# Patient Record
Sex: Female | Born: 1990 | Race: Black or African American | Hispanic: Yes | Marital: Single | Smoking: Never smoker
Health system: Southern US, Community
[De-identification: ages and names within clinical notes are randomized; demographics above are authoritative.]

## PROBLEM LIST (undated history)

## (undated) DIAGNOSIS — E282 Polycystic ovarian syndrome: Secondary | ICD-10-CM

## (undated) DIAGNOSIS — F32A Depression, unspecified: Secondary | ICD-10-CM

## (undated) DIAGNOSIS — F329 Major depressive disorder, single episode, unspecified: Secondary | ICD-10-CM

## (undated) HISTORY — DX: Depression, unspecified: F32.A

## (undated) HISTORY — DX: Polycystic ovarian syndrome: E28.2

## (undated) HISTORY — DX: Major depressive disorder, single episode, unspecified: F32.9

---
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
## 2010-09-21 ENCOUNTER — Encounter: Payer: Self-pay | Admitting: Obstetrics and Gynecology

## 2010-09-21 ENCOUNTER — Ambulatory Visit: Payer: Self-pay | Admitting: Obstetrics & Gynecology

## 2010-09-21 LAB — CONVERTED CEMR LAB
Chlamydia, DNA Probe: NEGATIVE
GC Probe Amp, Genital: NEGATIVE

## 2010-09-22 ENCOUNTER — Encounter: Payer: Self-pay | Admitting: Obstetrics and Gynecology

## 2011-03-17 ENCOUNTER — Other Ambulatory Visit: Payer: Self-pay | Admitting: Family Medicine

## 2011-03-17 ENCOUNTER — Other Ambulatory Visit (HOSPITAL_COMMUNITY)
Admission: RE | Admit: 2011-03-17 | Discharge: 2011-03-17 | Disposition: A | Discharge status: Home / Self Care | Payer: 59 | Source: Ambulatory Visit | Attending: Family Medicine | Admitting: Family Medicine

## 2011-03-17 DIAGNOSIS — N912 Amenorrhea, unspecified: Secondary | ICD-10-CM

## 2011-03-17 DIAGNOSIS — Z124 Encounter for screening for malignant neoplasm of cervix: Secondary | ICD-10-CM | POA: Insufficient documentation

## 2011-04-02 ENCOUNTER — Ambulatory Visit
Admission: RE | Admit: 2011-04-02 | Discharge: 2011-04-02 | Disposition: A | Discharge status: Home / Self Care | Payer: 59 | Source: Ambulatory Visit | Attending: Family Medicine | Admitting: Family Medicine

## 2011-04-02 DIAGNOSIS — N912 Amenorrhea, unspecified: Secondary | ICD-10-CM

## 2011-05-11 NOTE — Group Therapy Note (Signed)
NAMEHARPREET, SIGNORE NO.:  [id number]   MEDICAL RECORD NO.:  [id number]          PATIENT TYPE:  WOC   LOCATION:  [facility]                   FACILITY:  [facility]   PHYSICIAN:  Dorthula Perfect, MD     DATE OF BIRTH:  [birth year]   DATE OF SERVICE:  10/23/2008                                  CLINIC NOTE   HISTORY:  This 20 year old single black female, gravida 0, last  menstrual period May 2009 is referred here for missing her periods and  management of birth control.  Since May of this year, she has not had a  menstrual period.  Prior to that time, she was having menstrual periods  every other month and would never go more than 1 month without a period.  She is a Printmaker in college.  She is sexually active and does not  practice very good birth control.  She is complaining of frequency of  urination and a vaginal discharge.  She has never had a Pap smear.   REVIEW OF SYSTEMS:  Unremarkable.  She has no other genitourinary or GI  complaints.   MEDICATIONS:  None.   PAST SURGICAL HISTORY:  None.   PHYSICAL EXAMINATION:  VITAL SIGNS:  Height 61 inches, weight 164, blood  pressure 112/71.  ABDOMEN:  Her exam is directed to the abdomen and pelvis.  Her abdomen  is soft and nontender.  PELVIC:  External genitalia and BUS glands are normal.  Vaginal vault  was completely normal.  Cervix was nulliparous and epithelialized.  She  did have a slight amount of whitish vaginal discharge.  Uterus is in the  midline and is of normal size and shape.  Adnexal structures were  normal.   Wet prep was done which I reviewed.  It is completely normal.   ASSESSMENT:  1. Secondary amenorrhea.  2. Dysuria and urinary frequency.   DISPOSITION:  1. Clean-catch urine for routine and culture.  2. 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  She will start Provera 10 mg to take 1 a      day for 10 days to bring on her period.   Following that, she will      start Loestrin FE which she will take.  She is given 2      prescriptions and will follow up at the [facility] for      ongoing birth control.  I strongly urged the birth control as it      will keep her periods regular and will provide her good birth      control.           ______________________________  Dorthula Perfect, MD     ER/MEDQ  D:  10/23/2008  T:  10/23/2008  Job:  [id number]   cc:   [facility]

## 2011-09-28 LAB — POCT URINALYSIS DIP (DEVICE)
Nitrite: NEGATIVE
Operator id: 297281
Protein, ur: NEGATIVE
Urobilinogen, UA: 0.2
pH: 7.5

## 2012-01-27 IMAGING — US US TRANSVAGINAL NON-OB
1 series · 14 of 25 positions shown · non-contrast
Comparison: None.

CLINICAL DATA: Amenorrhea.  Evaluate endometrium, possible PCOS



[Series 1: us transvaginal non-ob · 0.20mm/px · 14 of 75 slices shown]
[im 1/75]
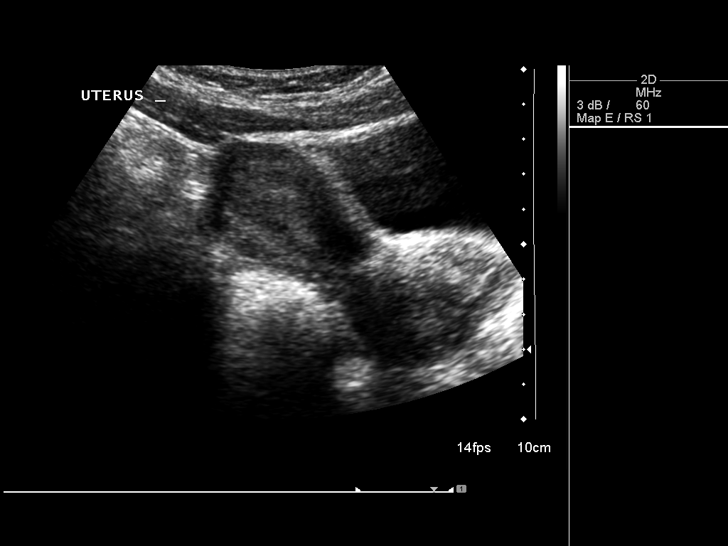
[im 7/75]
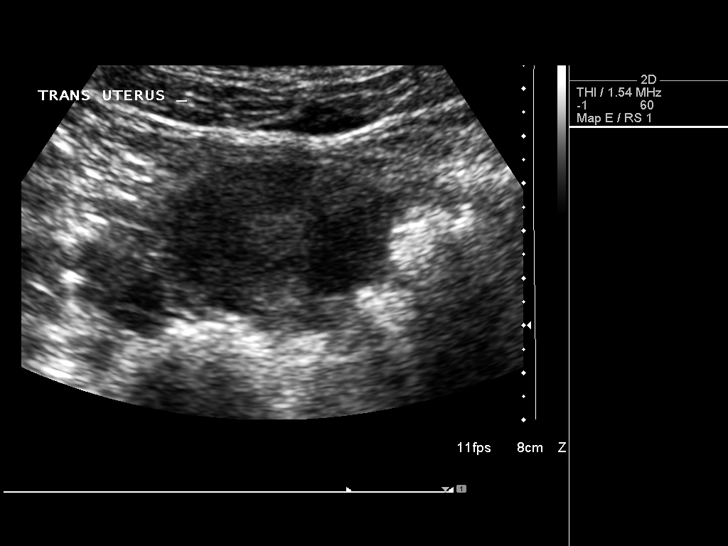
[im 13/75]
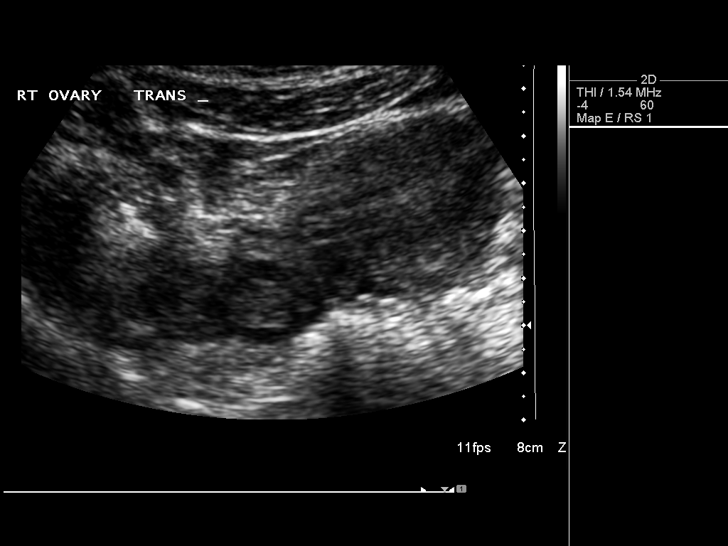
[im 19/75]
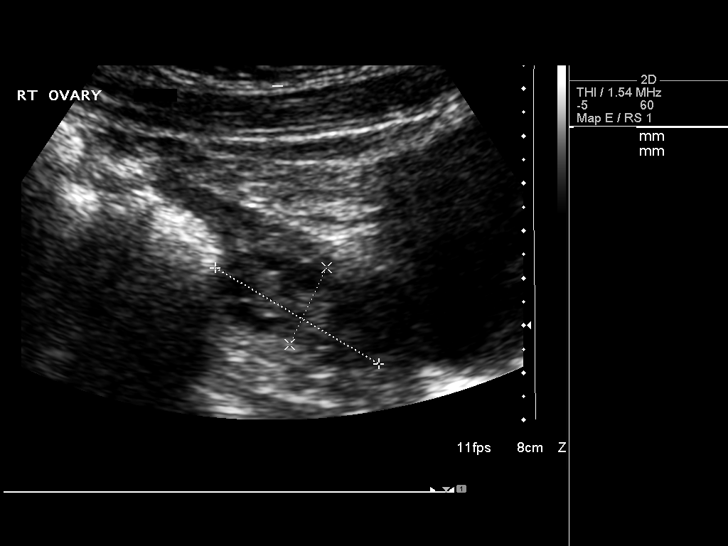
[im 25/75]
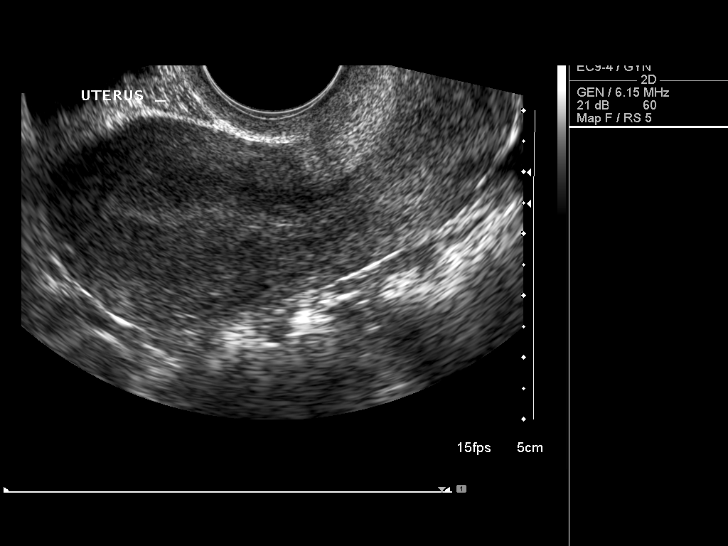
[im 28/75]
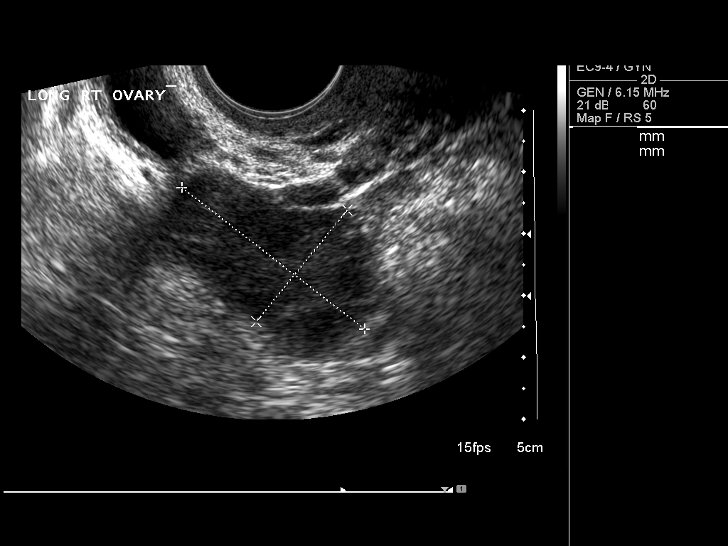
[im 34/75]
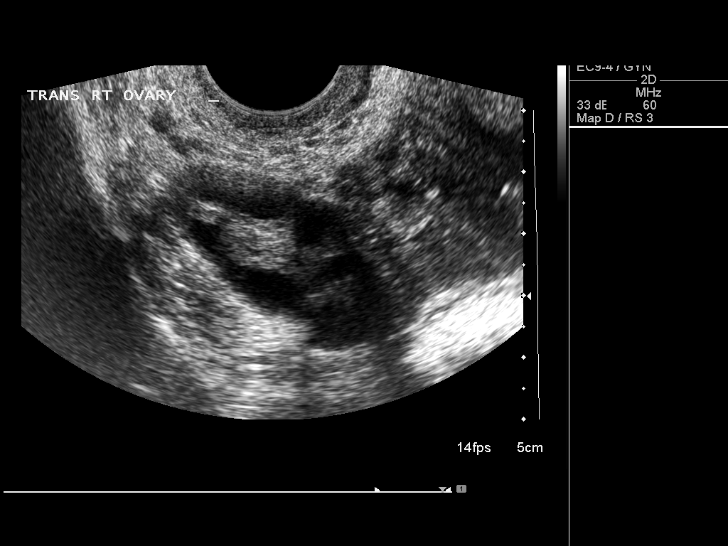
[im 41/75]
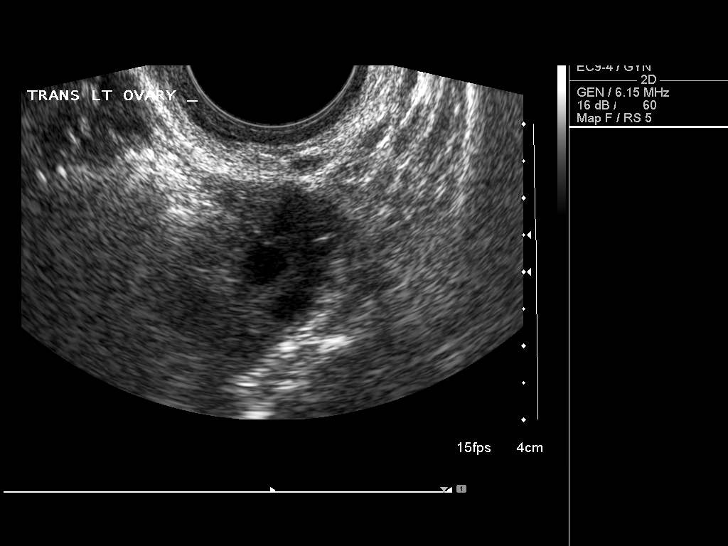
[im 47/75]
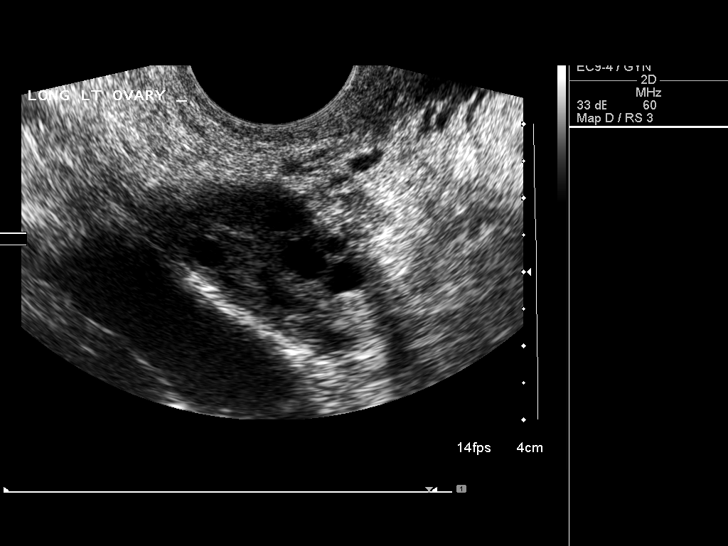
[im 50/75]
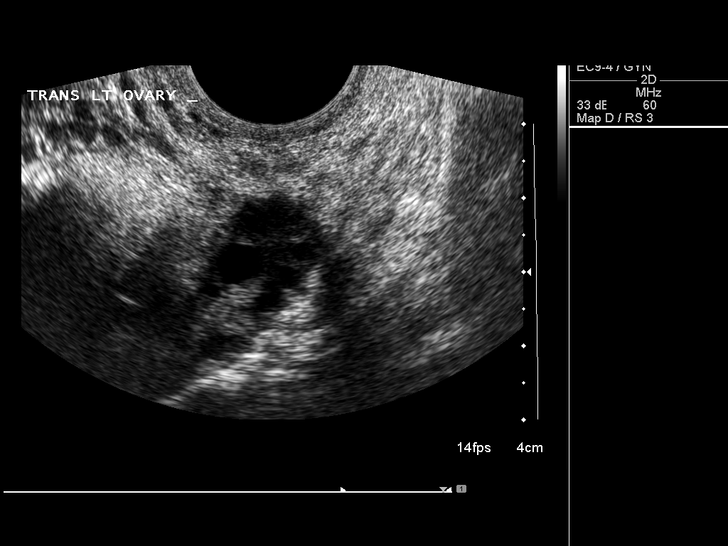
[im 56/75]
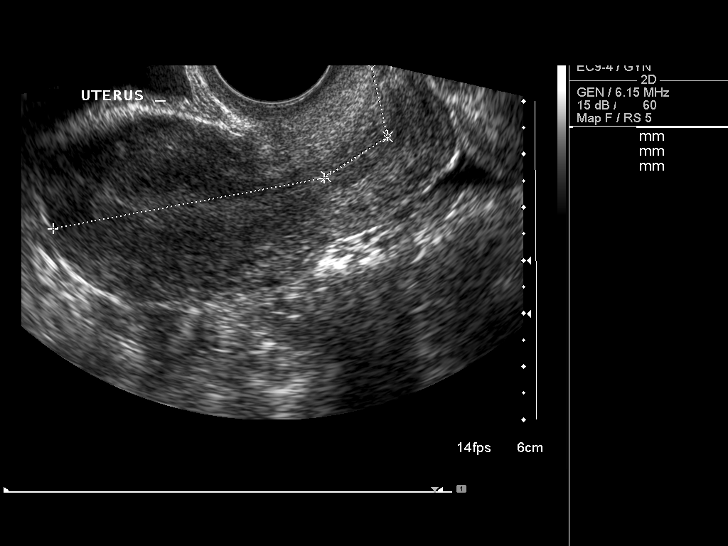
[im 62/75]
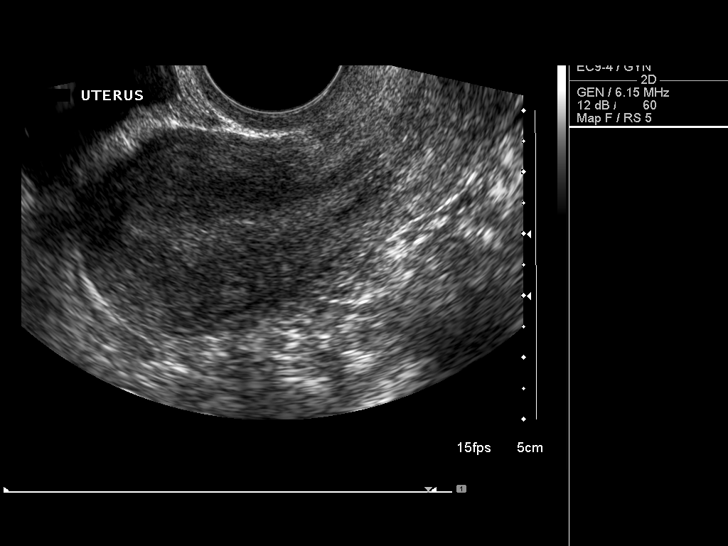
[im 68/75]
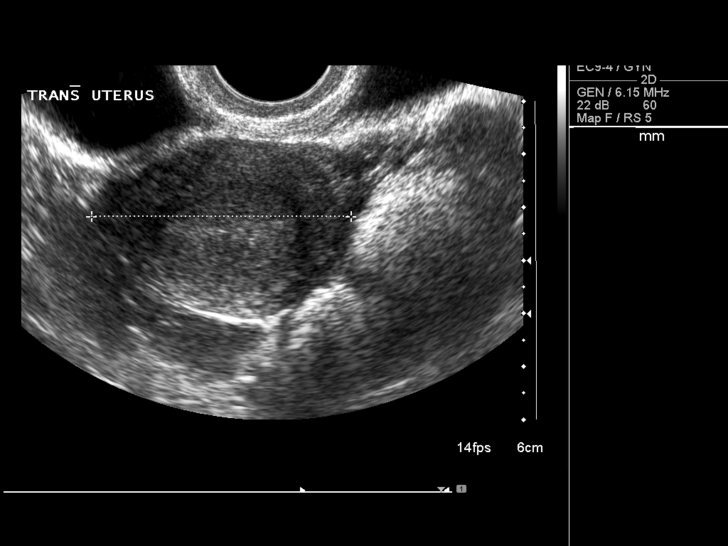
[im 75/75]
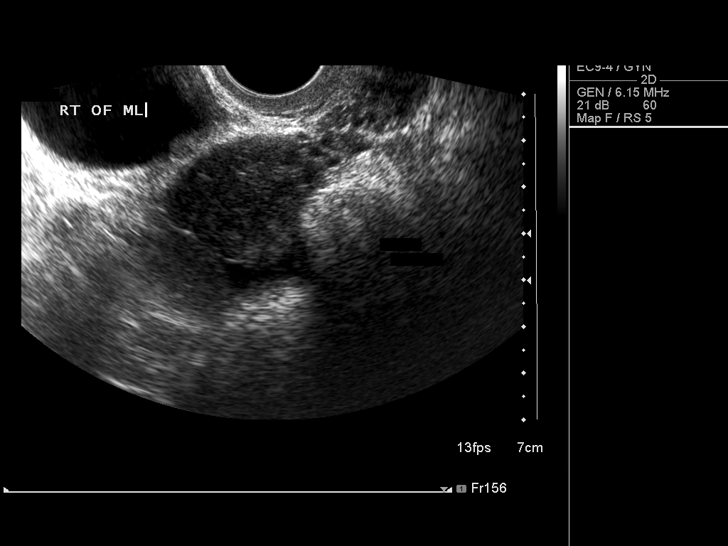

[14 of 25 positions shown; findings below may reference images not displayed]

FINDINGS: Uterus:  Measures 9.2 x 3.5 x 5.2 cm.  No fibroids or other uterine
masses identified.

Endometrium:  Uniformly echogenic measuring 14 mm.

Right ovary:  3.7 x 2.3 x 4.0 cm.  Multiple follicles are seen
along the periphery of the ovary (at least 10).  No dominant mass
is seen.

Left ovary:  Measures 3.4 x 1.8 x 1.7 cm.  Multiple peripherally at
the follicles are seen (at least 8-10).

Other findings:  Trace free fluid
IMPRESSION: Normal sonographic appearance of the uterus.  Multiple peripherally
oriented follicles seen in the left greater than right ovary
suspicious for polycystic ovarian syndrome.

## 2014-02-26 ENCOUNTER — Other Ambulatory Visit: Payer: Self-pay | Admitting: Obstetrics & Gynecology

## 2014-02-26 ENCOUNTER — Other Ambulatory Visit (HOSPITAL_COMMUNITY)
Admission: RE | Admit: 2014-02-26 | Discharge: 2014-02-26 | Disposition: A | Discharge status: Home / Self Care | Payer: BC Managed Care – PPO | Source: Ambulatory Visit | Attending: Obstetrics & Gynecology | Admitting: Obstetrics & Gynecology

## 2014-02-26 DIAGNOSIS — Z01419 Encounter for gynecological examination (general) (routine) without abnormal findings: Secondary | ICD-10-CM | POA: Insufficient documentation

## 2018-01-23 ENCOUNTER — Telehealth: Payer: Self-pay | Admitting: Obstetrics and Gynecology

## 2018-01-23 NOTE — Telephone Encounter (Signed)
Called and left a message for patient to call back to schedule a new patient doctor referral appointment with our office for a consultation re: abnormal pap results.

## 2018-01-26 NOTE — Telephone Encounter (Signed)
Called and left a message for patient to call back to schedule a new patient doctor referral appointment with our office for a consultation re: abnormal pap results. °

## 2018-02-17 ENCOUNTER — Other Ambulatory Visit: Payer: Self-pay

## 2018-02-17 ENCOUNTER — Ambulatory Visit (INDEPENDENT_AMBULATORY_CARE_PROVIDER_SITE_OTHER): Payer: BLUE CROSS/BLUE SHIELD | Admitting: Obstetrics and Gynecology

## 2018-02-17 ENCOUNTER — Encounter: Payer: Self-pay | Admitting: Obstetrics and Gynecology

## 2018-02-17 VITALS — BP 118/78 | HR 88 | Resp 16 | Ht 62.0 in | Wt 209.0 lb

## 2018-02-17 DIAGNOSIS — B977 Papillomavirus as the cause of diseases classified elsewhere: Secondary | ICD-10-CM | POA: Diagnosis not present

## 2018-02-17 DIAGNOSIS — R87612 Low grade squamous intraepithelial lesion on cytologic smear of cervix (LGSIL): Secondary | ICD-10-CM | POA: Diagnosis not present

## 2018-02-17 NOTE — Patient Instructions (Signed)
Colposcopy  Colposcopy is a procedure to examine the lowest part of the uterus (cervix), the vagina, and the area around the vaginal opening (vulva) for abnormalities or signs of disease. The procedure is done using a lighted microscope or magnifying lens (colposcope). If any unusual cells are found during the procedure, your health care provider may remove a tissue sample for testing (biopsy). A colposcopy may be done if you:   Have an abnormal Pap test. A Pap test is a screening test that is used to check for signs of cancer or infection of the vagina, cervix, and uterus.   Have a Pap smear test in which you test positive for high-risk HPV (human papillomavirus).   Have a sore or lesion on your cervix.   Have genital warts on your vulva, vagina, or cervix.   Took certain medicines while pregnant, such as diethylstilbestrol (DES).   Have pain during sexual intercourse.   Have vaginal bleeding, especially after sexual intercourse.   Need to have a cervical polyp removed.   Need to have a lost intrauterine device (IUD) string located.    Let your health care provider know about:   Any allergies you have, including allergies to prescribed medicine, latex, or iodine.   All medicines you are taking, including vitamins, herbs, eye drops, creams, and over-the-counter medicines. Bring a list of all of your medicines to your appointment.   Any problems you or family members have had with anesthetic medicines.   Any blood disorders you have.   Any surgeries you have had.   Any medical conditions you have, such as pelvic inflammatory disease (PID) or endometrial disorder.   Any history of frequent fainting.   Your menstrual cycle and what form of birth control (contraception) you use.   Your medical history, including any prior cervical treatment.   Whether you are pregnant or may be pregnant.  What are the risks?  Generally, this is a safe procedure. However, problems may occur,  including:   Pain.   Infection, which may include a fever, bad-smelling discharge, or pelvic pain.   Bleeding or discharge.   Misdiagnosis.   Fainting and vasovagal reactions, but this is rare.   Allergic reactions to medicines.   Damage to other structures or organs.    What happens before the procedure?   If you have your menstrual period or will have it at the time of your procedure, tell your health care provider. A colposcopy typically is not done during menstruation.   Continue your contraceptive practices before and after the procedure.   For 24 hours before the colposcopy:  ? Do not douche.  ? Do not use tampons.  ? Do not use medicines, creams, or suppositories in the vagina.  ? Do not have sexual intercourse.   Ask your health care provider about:  ? Changing or stopping your regular medicines. This is especially important if you are taking diabetes medicines or blood thinners.  ? Taking medicines such as aspirin and ibuprofen. These medicines can thin your blood. Do not take these medicines before your procedure if your health care provider instructs you not to. It is likely that your health care provider will tell you to avoid taking aspirin or medicine that contains aspirin for 7 days before the procedure.   Follow instructions from your health care provider about eating or drinking restrictions. You will likely need to eat a regular diet the day of the procedure and not skip any meals.   You   may have an exam or testing. A pregnancy test will be taken on the day of the procedure.   You may have a blood or urine sample taken.   Plan to have someone take you home from the hospital or clinic.   If you will be going home right after the procedure, plan to have someone with you for 24 hours.  What happens during the procedure?   You will lie down on your back, with your feet in foot rests (stirrups).   A warmed and lubricated instrument (speculum) will be inserted into your vagina. The  speculum will be used to hold apart the walls of your vagina so your health care provider can see your cervix and the inside of your vagina.   A cotton swab will be used to place a small amount of liquid solution on the areas to be examined. This solution makes it easier to see abnormal cells. You may feel a slight burning during this part.   The colposcope will be used to scan the cervix with a bright white light. The colposcope will be held near your vulvaand will magnify your vulva, vagina, and cervix for easier examination.   Your health care provider may decide to take a biopsy. If so:  ? You may be given medicine to numb the area (local anesthetic).  ? Surgical instruments will be used to suck out mucus and cells through your vagina.  ? You may feel mild pain while the tissue sample is removed.  ? Bleeding may occur. A solution may be used to stop the bleeding.  ? If a sample of tissue is needed from the inside of the cervix, a different procedure called endocervical curettage (ECC) may be completed. During this procedure, a curved instrument (curette) will be used to scrape cells from your cervix or the top of your cervix (endocervix).   Your health care provider will record the location of any abnormalities.  The procedure may vary among health care providers and hospitals.  What happens after the procedure?   You will lie down and rest for a few minutes. You may be offered juice or cookies.   Your blood pressure, heart rate, breathing rate, and blood oxygen level will be monitored until any medicines you were given have worn off.   You may have to wear compression stockings. These stockings help to prevent blood clots and reduce swelling in your legs.   You may have some cramping in your abdomen. This should go away after a few minutes.  This information is not intended to replace advice given to you by your health care provider. Make sure you discuss any questions you have with your health care  provider.  Document Released: 03/05/2003 Document Revised: 08/10/2016 Document Reviewed: 07/19/2016  Elsevier Interactive Patient Education  2018 Elsevier Inc.

## 2018-02-17 NOTE — Progress Notes (Signed)
27 y.o. G0P0000 SingleAfrican AmericanF here for a consultation from Montey HoraBill Webster, [place] for an abnormal PAP. 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  Not currently sexually active.  Period Cycle (Days): 28 Period Duration (Days): 5-6 days Period Pattern: Regular Menstrual Flow: Light Menstrual Control: Thin pad Menstrual Control Change Freq (Hours): changes pad every 5-6 hours  Dysmenorrhea: None  Patient's last menstrual period was 01/23/2018.          Sexually active: Yes.    The current method of family planning is OCP (estrogen/progesterone).    Exercising: No.  The patient does not participate in regular exercise at present. Smoker:  no  Health Maintenance: Pap:  01-05-18 LSIL + HPV History of abnormal Pap:  yes TDaP:  unsure Gardasil: completed all 3    reports that  has never smoked. she has never used smokeless tobacco. She reports that she does not drink alcohol or use drugs. She is in Graduate school for counseling.   Past Medical History:  Diagnosis Date  . Depression   . PCOS (polycystic ovarian syndrome)     History reviewed. No pertinent surgical history.  Current Outpatient Medications  Medication Sig Dispense Refill  . NIKKI 3-0.02 MG tablet   0   No current facility-administered medications for this visit.     Family History  Problem Relation Age of Onset  . Fibroids Mother   . Diabetes Mother   . Diabetes Maternal Grandmother   . Alzheimer's disease Maternal Grandmother   . Diabetes Paternal Grandmother     Review of Systems  Constitutional: Negative.   HENT: Negative.   Eyes: Negative.   Respiratory: Negative.   Cardiovascular: Negative.   Gastrointestinal: Negative.   Endocrine: Negative.   Genitourinary: Negative.   Musculoskeletal: Negative.   Skin: Negative.   Allergic/Immunologic: Negative.   Neurological: Negative.   Psychiatric/Behavioral: Negative.     Exam:   BP 118/78 (BP Location: Right Arm, Patient Position: Sitting,  Cuff Size: Normal)   Pulse 88   Resp 16   Ht 5\' 2"  (1.575 m)   Wt 209 lb (94.8 kg)   LMP 01/23/2018   BMI 38.23 kg/m   Weight change: @WEIGHTCHANGE @ Height:   Height: 5\' 2"  (157.5 cm)  Ht Readings from Last 3 Encounters:  02/17/18 5\' 2"  (1.575 m)    General appearance: alert, cooperative and appears stated age  A:  LSIL, +HPV  P:   Discussed HPV, abnormal pap's, explained cervical dysplasia, need for further evaluation  Return for colposcopy  We discussed need for f/u and possible need for treatment (if she had HSIL)  Questions answered   Montey HoraBill Webster, PA  Letter sent

## 2018-03-02 ENCOUNTER — Encounter: Payer: Self-pay | Admitting: Obstetrics and Gynecology

## 2018-03-02 ENCOUNTER — Ambulatory Visit (INDEPENDENT_AMBULATORY_CARE_PROVIDER_SITE_OTHER): Payer: BLUE CROSS/BLUE SHIELD | Admitting: Obstetrics and Gynecology

## 2018-03-02 VITALS — BP 120/78 | HR 84 | Wt 210.0 lb

## 2018-03-02 DIAGNOSIS — R87612 Low grade squamous intraepithelial lesion on cytologic smear of cervix (LGSIL): Secondary | ICD-10-CM | POA: Diagnosis not present

## 2018-03-02 DIAGNOSIS — B977 Papillomavirus as the cause of diseases classified elsewhere: Secondary | ICD-10-CM

## 2018-03-02 DIAGNOSIS — Z01812 Encounter for preprocedural laboratory examination: Secondary | ICD-10-CM | POA: Diagnosis not present

## 2018-03-02 LAB — POCT URINE PREGNANCY: PREG TEST UR: NEGATIVE

## 2018-03-02 NOTE — Patient Instructions (Signed)

## 2018-03-02 NOTE — Progress Notes (Signed)
GYNECOLOGY  VISIT   HPI: 27 y.o.   Single  African American  female   G0P0000 with Patient's last menstrual period was 02/20/2018 (exact date).   here for Colposcopy for a LSIL pap with +HPV  GYNECOLOGIC HISTORY: Patient's last menstrual period was 02/20/2018 (exact date). Contraception:OCP Menopausal hormone therapy: n/a        OB History    Gravida Para Term Preterm AB Living   0 0 0 0 0 0   SAB TAB Ectopic Multiple Live Births   0 0 0 0 0         There are no active problems to display for this patient.   Past Medical History:  Diagnosis Date  . Depression   . PCOS (polycystic ovarian syndrome)     No past surgical history on file.  Current Outpatient Medications  Medication Sig Dispense Refill  . NIKKI 3-0.02 MG tablet   0   No current facility-administered medications for this visit.      ALLERGIES: Patient has no known allergies.  Family History  Problem Relation Age of Onset  . Fibroids Mother   . Diabetes Mother   . Diabetes Maternal Grandmother   . Alzheimer's disease Maternal Grandmother   . Diabetes Paternal Grandmother     Social History   Socioeconomic History  . Marital status: Single    Spouse name: Not on file  . Number of children: Not on file  . Years of education: Not on file  . Highest education level: Not on file  Social Needs  . Financial resource strain: Not on file  . Food insecurity - worry: Not on file  . Food insecurity - inability: Not on file  . Transportation needs - medical: Not on file  . Transportation needs - non-medical: Not on file  Occupational History  . Not on file  Tobacco Use  . Smoking status: Never Smoker  . Smokeless tobacco: Never Used  Substance and Sexual Activity  . Alcohol use: No    Frequency: Never  . Drug use: No  . Sexual activity: Yes    Partners: Male    Birth control/protection: Pill  Other Topics Concern  . Not on file  Social History Narrative  . Not on file    ROS  PHYSICAL  EXAMINATION:    BP 120/78 (BP Location: Right Arm, Patient Position: Sitting, Cuff Size: Large)   Pulse 84   Wt 210 lb (95.3 kg)   LMP 02/20/2018 (Exact Date)   BMI 38.41 kg/m     General appearance: alert, cooperative and appears stated age   Pelvic: External genitalia:  no lesions              Urethra:  normal appearing urethra with no masses, tenderness or lesions              Bartholins and Skenes: normal                 Vagina: normal appearing vagina with normal color and discharge, no lesions              Cervix: no gross lesions  Colposcopy: satisfactory, minimal aceto-white changes at 10 o'clock, biopsy done. Negative lugols examination of the upper vagina.   Chaperone was present for exam.  ASSESSMENT LSIL pap with +HPV    PLAN Colposcopy with biopsy and ECC Further plans depending on the results   An After Visit Summary was printed and given to the patient.  CC: [employer]  Zenda AlpersWebster, [place]

## 2018-03-07 ENCOUNTER — Telehealth: Payer: Self-pay | Admitting: *Deleted

## 2018-03-07 DIAGNOSIS — N871 Moderate cervical dysplasia: Secondary | ICD-10-CM

## 2018-03-07 NOTE — Telephone Encounter (Signed)
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 Please explain and set this up.

## 2018-03-07 NOTE — Telephone Encounter (Signed)
Call to patient regarding colpo results. Left message to call back. Can speak to triage nurse.

## 2018-03-08 NOTE — Telephone Encounter (Signed)
Spoke with patient, advised of results and recommendations as seen below per Dr. Oscar LaJertson. Brief explanation of LEEP procedure provided, questions answered.   OCP for contraceptive, LMP 02/20/18.   LEEP scheduled for 03/28/18 at 12:45pm with Dr. Oscar LaJertson. Advised to take Motrin 800 mg with food and water one hour before procedure.   Order placed for LEEP. Patient aware business office will return call to review benefits.   Routing to provider for final review. Patient is agreeable to disposition. Will close encounter.  Cc: Harland DingwallSuzy Dixon, [place]

## 2018-03-28 ENCOUNTER — Other Ambulatory Visit: Payer: Self-pay

## 2018-03-28 ENCOUNTER — Ambulatory Visit: Payer: Self-pay

## 2018-03-28 ENCOUNTER — Encounter: Payer: Self-pay | Admitting: Obstetrics and Gynecology

## 2018-03-28 ENCOUNTER — Ambulatory Visit (INDEPENDENT_AMBULATORY_CARE_PROVIDER_SITE_OTHER): Payer: BLUE CROSS/BLUE SHIELD | Admitting: Obstetrics and Gynecology

## 2018-03-28 VITALS — BP 110/70 | HR 80 | Resp 14 | Wt 209.0 lb

## 2018-03-28 DIAGNOSIS — N871 Moderate cervical dysplasia: Secondary | ICD-10-CM

## 2018-03-28 DIAGNOSIS — Z01812 Encounter for preprocedural laboratory examination: Secondary | ICD-10-CM | POA: Diagnosis not present

## 2018-03-28 LAB — POCT URINE PREGNANCY: PREG TEST UR: NEGATIVE

## 2018-03-28 NOTE — Patient Instructions (Signed)

## 2018-03-28 NOTE — Progress Notes (Signed)
GYNECOLOGY  VISIT   HPI: 27 y.o.   Single  Hispanic  female   G0P0000 with Patient's last menstrual period was 03/20/2018.   here for a LEEP, recent colposcopic biopsy with CIN II, ECC with CIN I      GYNECOLOGIC HISTORY: Patient's last menstrual period was 03/20/2018. Contraception:OCP Menopausal hormone therapy: none         OB History    Gravida  0   Para  0   Term  0   Preterm  0   AB  0   Living  0     SAB  0   TAB  0   Ectopic  0   Multiple  0   Live Births  0              There are no active problems to display for this patient.   Past Medical History:  Diagnosis Date  . Depression   . PCOS (polycystic ovarian syndrome)     History reviewed. No pertinent surgical history.  Current Outpatient Medications  Medication Sig Dispense Refill  . NIKKI 3-0.02 MG tablet   0   No current facility-administered medications for this visit.      ALLERGIES: Patient has no known allergies.  Family History  Problem Relation Age of Onset  . Fibroids Mother   . Diabetes Mother   . Diabetes Maternal Grandmother   . Alzheimer's disease Maternal Grandmother   . Diabetes Paternal Grandmother     Social History   Socioeconomic History  . Marital status: Single    Spouse name: Not on file  . Number of children: Not on file  . Years of education: Not on file  . Highest education level: Not on file  Occupational History  . Not on file  Social Needs  . Financial resource strain: Not on file  . Food insecurity:    Worry: Not on file    Inability: Not on file  . Transportation needs:    Medical: Not on file    Non-medical: Not on file  Tobacco Use  . Smoking status: Never Smoker  . Smokeless tobacco: Never Used  Substance and Sexual Activity  . Alcohol use: No    Frequency: Never  . Drug use: No  . Sexual activity: Yes    Partners: Male    Birth control/protection: Pill  Lifestyle  . Physical activity:    Days per week: Not on file     Minutes per session: Not on file  . Stress: Not on file  Relationships  . Social connections:    Talks on phone: Not on file    Gets together: Not on file    Attends religious service: Not on file    Active member of club or organization: Not on file    Attends meetings of clubs or organizations: Not on file    Relationship status: Not on file  . Intimate partner violence:    Fear of current or ex partner: Not on file    Emotionally abused: Not on file    Physically abused: Not on file    Forced sexual activity: Not on file  Other Topics Concern  . Not on file  Social History Narrative  . Not on file    Review of Systems  Constitutional: Negative.   HENT: Negative.   Eyes: Negative.   Respiratory: Negative.   Cardiovascular: Negative.   Gastrointestinal: Negative.   Genitourinary: Negative.   Musculoskeletal: Negative.  Skin: Negative.   Neurological: Negative.   Endo/Heme/Allergies: Negative.   Psychiatric/Behavioral: Negative.     PHYSICAL EXAMINATION:    BP 110/70 (BP Location: Right Arm, Patient Position: Sitting, Cuff Size: Large)   Pulse 80   Resp 14   Wt 209 lb (94.8 kg)   LMP 03/20/2018   BMI 38.23 kg/m     General appearance: alert, cooperative and appears stated age  Pelvic: External genitalia:  no lesions              Urethra:  normal appearing urethra with no masses, tenderness or lesions              Bartholins and Skenes: normal                 Vagina: normal appearing vagina with normal color and discharge, no lesions              Cervix: no gross lesions  Procedure: The patient was counseled as to the risks of the procedure, including: infection, bleeding, future pregnancy risks and cervical stenosis. A consent form was signed.  Under colposcopic guidance, acetic acid and then Lugols solution was placed on the cervix and a paracervical block was injected using 1% lidocaine with epinephrine. Under colposcopic guidance, the 2 x 0.8 cm loop was  used to remove a portion of the ectocervix taking care to get the entire transformation zone.  A second 1 x 1 cm loop was used to remove a portion of the endocervix. The settings were 55 cut, 50 coag with a blend of 1.  An ECC was performed. The cautery ball was then used to cauterize the base of the biopsy site and monsels were placed. The patient tolerated the procedure well.   Chaperone was present for exam.  ASSESSMENT CIN II, +ECC   PLAN Loop cone cervical biopsy, ECC F/U in one month   An After Visit Summary was printed and given to the patient.

## 2018-03-29 ENCOUNTER — Encounter: Payer: Self-pay | Admitting: Obstetrics and Gynecology

## 2020-06-13 ENCOUNTER — Telehealth: Payer: Self-pay

## 2020-06-13 NOTE — Telephone Encounter (Signed)
Patient was referred to our office for abnormal pap smear by Caren Macadam, PA in 2019. Patient had a colposcopy with Dr.Jertson on 03/28/2018. Results showed CIN I. 12 month and 24 month recall were recommended with PCP. Patient moved and is now being followed by a provider in [place]. Notes are in Epic. Removed from 08 recall.  Routing to provider and will close encounter.
